# Patient Record
Sex: Female | Born: 1965 | Hispanic: No | Marital: Married
Health system: Southern US, Community
[De-identification: ages and names within clinical notes are randomized; demographics above are authoritative.]

---

## 1998-02-28 ENCOUNTER — Emergency Department (HOSPITAL_COMMUNITY): Admission: EM | Admit: 1998-02-28 | Discharge: 1998-02-28 | Payer: Self-pay | Admitting: Emergency Medicine

## 1998-11-07 ENCOUNTER — Other Ambulatory Visit: Admission: RE | Admit: 1998-11-07 | Discharge: 1998-11-07 | Payer: Self-pay | Admitting: Obstetrics

## 2001-04-19 ENCOUNTER — Inpatient Hospital Stay (HOSPITAL_COMMUNITY): Admission: AD | Admit: 2001-04-19 | Discharge: 2001-04-22 | Payer: Self-pay | Admitting: Obstetrics

## 2001-04-19 ENCOUNTER — Inpatient Hospital Stay (HOSPITAL_COMMUNITY): Admission: AD | Admit: 2001-04-19 | Discharge: 2001-04-19 | Payer: Self-pay | Admitting: Obstetrics

## 2001-04-19 ENCOUNTER — Encounter (INDEPENDENT_AMBULATORY_CARE_PROVIDER_SITE_OTHER): Payer: Self-pay

## 2001-04-19 ENCOUNTER — Encounter: Payer: Self-pay | Admitting: Obstetrics

## 2009-11-25 ENCOUNTER — Inpatient Hospital Stay (HOSPITAL_COMMUNITY): Admission: AD | Admit: 2009-11-25 | Discharge: 2009-11-25 | Payer: Self-pay | Admitting: Family Medicine

## 2009-11-28 ENCOUNTER — Inpatient Hospital Stay (HOSPITAL_COMMUNITY): Admission: AD | Admit: 2009-11-28 | Discharge: 2009-11-28 | Payer: Self-pay | Admitting: Family Medicine

## 2009-11-28 ENCOUNTER — Ambulatory Visit: Payer: Self-pay | Admitting: Obstetrics and Gynecology

## 2010-11-18 LAB — HCG, QUANTITATIVE, PREGNANCY: hCG, Beta Chain, Quant, S: 15 m[IU]/mL — ABNORMAL HIGH (ref <5)

## 2010-11-22 LAB — CBC
HCT: 33.6 % — ABNORMAL LOW (ref 36.0–46.0)
MCV: 75.9 fL — ABNORMAL LOW (ref 78.0–100.0)
Platelets: 298 10*3/uL (ref 150–400)
RBC: 4.42 MIL/uL (ref 3.87–5.11)
WBC: 8.5 10*3/uL (ref 4.0–10.5)

## 2010-11-22 LAB — HCG, QUANTITATIVE, PREGNANCY: hCG, Beta Chain, Quant, S: 46 m[IU]/mL — ABNORMAL HIGH (ref <5)

## 2010-11-22 LAB — DIFFERENTIAL
Eosinophils Absolute: 0.2 10*3/uL (ref 0.0–0.7)
Eosinophils Relative: 2 % (ref 0–5)
Lymphocytes Relative: 24 % (ref 12–46)
Lymphs Abs: 2.1 10*3/uL (ref 0.7–4.0)
Monocytes Relative: 9 % (ref 3–12)

## 2010-11-22 LAB — ABO/RH: ABO/RH(D): O POS

## 2010-11-22 LAB — WET PREP, GENITAL
Clue Cells Wet Prep HPF POC: NONE SEEN
Trich, Wet Prep: NONE SEEN

## 2010-11-22 LAB — POCT PREGNANCY, URINE: Preg Test, Ur: POSITIVE

## 2011-05-13 ENCOUNTER — Other Ambulatory Visit: Payer: Self-pay | Admitting: Family Medicine

## 2011-05-13 DIAGNOSIS — N644 Mastodynia: Secondary | ICD-10-CM

## 2011-05-13 DIAGNOSIS — N63 Unspecified lump in unspecified breast: Secondary | ICD-10-CM

## 2016-02-07 ENCOUNTER — Emergency Department (HOSPITAL_COMMUNITY)
Admission: EM | Admit: 2016-02-07 | Discharge: 2016-02-07 | Disposition: A | Discharge status: Home / Self Care | Payer: No Typology Code available for payment source | Attending: Emergency Medicine | Admitting: Emergency Medicine

## 2016-02-07 ENCOUNTER — Emergency Department (HOSPITAL_COMMUNITY): Payer: No Typology Code available for payment source

## 2016-02-07 ENCOUNTER — Encounter (HOSPITAL_COMMUNITY): Payer: Self-pay | Admitting: *Deleted

## 2016-02-07 DIAGNOSIS — S2020XA Contusion of thorax, unspecified, initial encounter: Secondary | ICD-10-CM | POA: Insufficient documentation

## 2016-02-07 DIAGNOSIS — R55 Syncope and collapse: Secondary | ICD-10-CM

## 2016-02-07 DIAGNOSIS — R109 Unspecified abdominal pain: Secondary | ICD-10-CM | POA: Insufficient documentation

## 2016-02-07 DIAGNOSIS — S161XXA Strain of muscle, fascia and tendon at neck level, initial encounter: Secondary | ICD-10-CM

## 2016-02-07 DIAGNOSIS — R079 Chest pain, unspecified: Secondary | ICD-10-CM | POA: Insufficient documentation

## 2016-02-07 DIAGNOSIS — S301XXA Contusion of abdominal wall, initial encounter: Secondary | ICD-10-CM | POA: Diagnosis not present

## 2016-02-07 DIAGNOSIS — Y999 Unspecified external cause status: Secondary | ICD-10-CM | POA: Diagnosis not present

## 2016-02-07 DIAGNOSIS — S0990XA Unspecified injury of head, initial encounter: Secondary | ICD-10-CM | POA: Diagnosis not present

## 2016-02-07 DIAGNOSIS — Y939 Activity, unspecified: Secondary | ICD-10-CM | POA: Diagnosis not present

## 2016-02-07 DIAGNOSIS — Y92410 Unspecified street and highway as the place of occurrence of the external cause: Secondary | ICD-10-CM | POA: Diagnosis not present

## 2016-02-07 DIAGNOSIS — S199XXA Unspecified injury of neck, initial encounter: Secondary | ICD-10-CM | POA: Diagnosis present

## 2016-02-07 DIAGNOSIS — S20219A Contusion of unspecified front wall of thorax, initial encounter: Secondary | ICD-10-CM

## 2016-02-07 LAB — I-STAT TROPONIN, ED: Troponin i, poc: 0 ng/mL (ref 0.00–0.08)

## 2016-02-07 LAB — CBC WITH DIFFERENTIAL/PLATELET
BASOS ABS: 0 10*3/uL (ref 0.0–0.1)
Basophils Relative: 0 %
EOS ABS: 0.2 10*3/uL (ref 0.0–0.7)
Eosinophils Relative: 2 %
HCT: 32.7 % — ABNORMAL LOW (ref 36.0–46.0)
HEMOGLOBIN: 10.1 g/dL — AB (ref 12.0–15.0)
LYMPHS ABS: 2.8 10*3/uL (ref 0.7–4.0)
Lymphocytes Relative: 27 %
MCH: 23.9 pg — AB (ref 26.0–34.0)
MCHC: 30.9 g/dL (ref 30.0–36.0)
MCV: 77.3 fL — ABNORMAL LOW (ref 78.0–100.0)
Monocytes Absolute: 0.8 10*3/uL (ref 0.1–1.0)
Monocytes Relative: 8 %
NEUTROS PCT: 63 %
Neutro Abs: 6.8 10*3/uL (ref 1.7–7.7)
Platelets: 315 10*3/uL (ref 150–400)
RBC: 4.23 MIL/uL (ref 3.87–5.11)
RDW: 14.4 % (ref 11.5–15.5)
WBC: 10.6 10*3/uL — AB (ref 4.0–10.5)

## 2016-02-07 LAB — I-STAT CHEM 8, ED
BUN: 15 mg/dL (ref 6–20)
CALCIUM ION: 1.18 mmol/L (ref 1.12–1.23)
CHLORIDE: 106 mmol/L (ref 101–111)
Creatinine, Ser: 0.6 mg/dL (ref 0.44–1.00)
Glucose, Bld: 118 mg/dL — ABNORMAL HIGH (ref 65–99)
HEMATOCRIT: 34 % — AB (ref 36.0–46.0)
Hemoglobin: 11.6 g/dL — ABNORMAL LOW (ref 12.0–15.0)
POTASSIUM: 3.7 mmol/L (ref 3.5–5.1)
SODIUM: 140 mmol/L (ref 135–145)
TCO2: 21 mmol/L (ref 0–100)

## 2016-02-07 LAB — I-STAT BETA HCG BLOOD, ED (MC, WL, AP ONLY)

## 2016-02-07 LAB — ETHANOL: Alcohol, Ethyl (B): <5 mg/dL (ref <5)

## 2016-02-07 LAB — CBG MONITORING, ED: GLUCOSE-CAPILLARY: 145 mg/dL — AB (ref 65–99)

## 2016-02-07 MED ORDER — NAPROXEN 500 MG PO TABS: 500.0000 mg | ORAL_TABLET | Freq: Two times a day (BID) | ORAL | Status: AC

## 2016-02-07 MED ORDER — CYCLOBENZAPRINE HCL 10 MG PO TABS
10.0000 mg | ORAL_TABLET | Freq: Once | ORAL | Status: AC
  Administered 2016-02-07: 10 mg via ORAL
  Filled 2016-02-07: qty 1

## 2016-02-07 MED ORDER — HYDROCODONE-ACETAMINOPHEN 5-325 MG PO TABS: 1.0000 | ORAL_TABLET | Freq: Four times a day (QID) | ORAL | Status: AC | PRN

## 2016-02-07 MED ORDER — CYCLOBENZAPRINE HCL 10 MG PO TABS: 10.0000 mg | ORAL_TABLET | Freq: Two times a day (BID) | ORAL | Status: AC | PRN

## 2016-02-07 MED ORDER — IOPAMIDOL (ISOVUE-300) INJECTION 61%
INTRAVENOUS | Status: AC
  Administered 2016-02-07: 100 mL
  Filled 2016-02-07: qty 100

## 2016-02-07 MED ORDER — MORPHINE SULFATE (PF) 4 MG/ML IV SOLN
4.0000 mg | Freq: Once | INTRAVENOUS | Status: AC
  Administered 2016-02-07: 4 mg via INTRAVENOUS
  Filled 2016-02-07: qty 1

## 2016-02-07 MED ORDER — ONDANSETRON HCL 4 MG/2ML IJ SOLN
4.0000 mg | Freq: Once | INTRAMUSCULAR | Status: AC
  Administered 2016-02-07: 4 mg via INTRAVENOUS
  Filled 2016-02-07: qty 2

## 2016-02-07 MED ORDER — KETOROLAC TROMETHAMINE 30 MG/ML IJ SOLN
30.0000 mg | Freq: Once | INTRAMUSCULAR | Status: AC
  Administered 2016-02-07: 30 mg via INTRAVENOUS
  Filled 2016-02-07: qty 1

## 2016-02-07 MED ORDER — SODIUM CHLORIDE 0.9 % IV BOLUS (SEPSIS)
1000.0000 mL | Freq: Once | INTRAVENOUS | Status: AC
  Administered 2016-02-07: 1000 mL via INTRAVENOUS

## 2016-02-07 MED ORDER — IOPAMIDOL (ISOVUE-370) INJECTION 76%
INTRAVENOUS | Status: AC
  Filled 2016-02-07: qty 100

## 2016-02-07 NOTE — ED Notes (Signed)
Patient returned from CT

## 2016-02-07 NOTE — ED Notes (Signed)
EDP at bedside re-evaluating pt. 
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
## 2016-02-07 NOTE — ED Notes (Signed)
CBG 145  

## 2016-02-07 NOTE — ED Notes (Signed)
Backboard removed by Dr. Judd Lienelo, c-collar remains in place

## 2016-02-07 NOTE — ED Notes (Signed)
Patient transported to CT 
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
## 2016-02-07 NOTE — Progress Notes (Signed)
   02/07/16 1102  Clinical Encounter Type  Visited With Patient;Health care provider  Visit Type ED  Referral From Nurse  Spiritual Encounters  Spiritual Needs Emotional  Stress Factors  Patient Stress Factors Health changes  Advance Directives (For Healthcare)  Does patient have an advance directive? No  Would patient like information on creating an advanced directive? No - patient declined information  Chaplain responded to page, patient receiving treatment, family/friends were contacted. Provided ministry of presence. Patient taken for further diagnostic testing. Awaiting arrival of family
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
## 2016-02-07 NOTE — ED Notes (Signed)
Pt. Ate 100% of dinner, family at the bedside.

## 2016-02-07 NOTE — ED Notes (Signed)
Pt. ambulated steadily to toilet with minimal assistance , denies dizziness.

## 2016-02-07 NOTE — ED Notes (Signed)
Pt was discharge to the lobby and passed out and fell to the floor.

## 2016-02-07 NOTE — ED Notes (Signed)
Warm blankets applied
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
## 2023-03-17 ENCOUNTER — Telehealth: Payer: Self-pay

## 2023-03-17 NOTE — Telephone Encounter (Signed)
Telephoned patient at mobile number using interpreter#[phone]. Left a voice message with [facility] contact information.
